# Patient Record
Sex: Female | Born: 1935 | Race: White | Hispanic: No | Smoking: Never smoker
Health system: Southern US, Community
[De-identification: ages and names within clinical notes are randomized; demographics above are authoritative.]

## PROBLEM LIST (undated history)

## (undated) DIAGNOSIS — I10 Essential (primary) hypertension: Secondary | ICD-10-CM

## (undated) DIAGNOSIS — I4891 Unspecified atrial fibrillation: Secondary | ICD-10-CM

## (undated) DIAGNOSIS — R609 Edema, unspecified: Secondary | ICD-10-CM

## (undated) DIAGNOSIS — R2689 Other abnormalities of gait and mobility: Secondary | ICD-10-CM

## (undated) DIAGNOSIS — F0390 Unspecified dementia without behavioral disturbance: Secondary | ICD-10-CM

## (undated) HISTORY — DX: Other abnormalities of gait and mobility: R26.89

## (undated) HISTORY — DX: Edema, unspecified: R60.9

## (undated) HISTORY — DX: Unspecified dementia without behavioral disturbance: F03.90

## (undated) HISTORY — DX: Unspecified atrial fibrillation: I48.91

## (undated) HISTORY — DX: Essential (primary) hypertension: I10

---

## 2005-12-14 ENCOUNTER — Emergency Department (HOSPITAL_COMMUNITY): Admission: EM | Admit: 2005-12-14 | Discharge: 2005-12-14 | Payer: Self-pay | Admitting: Emergency Medicine

## 2005-12-15 ENCOUNTER — Inpatient Hospital Stay (HOSPITAL_COMMUNITY): Admission: AD | Admit: 2005-12-15 | Discharge: 2005-12-18 | Payer: Self-pay | Admitting: Internal Medicine

## 2006-04-24 ENCOUNTER — Ambulatory Visit: Payer: Self-pay | Admitting: Internal Medicine

## 2006-05-05 ENCOUNTER — Ambulatory Visit: Payer: Self-pay | Admitting: Internal Medicine

## 2007-09-02 IMAGING — CT CT HEAD W/O CM
1 of 2 series · 13 of 30 positions shown, 17 images · IV contrast (agent unspecified)
Comparison: none

CLINICAL DATA: Fever, altered LOCX.  Reportedly also fell.  
 HEAD CT WITHOUT CONTRAST:
TECHNIQUE: Contiguous axial images were obtained from the base of the skull through the vertex according to standard protocol without contrast.

[Series 2: brain · axial · 0.47mm/px · z∈[+107,+237]mm · 13 of 32 slices shown, 17 images]
[im 3/32  brain]
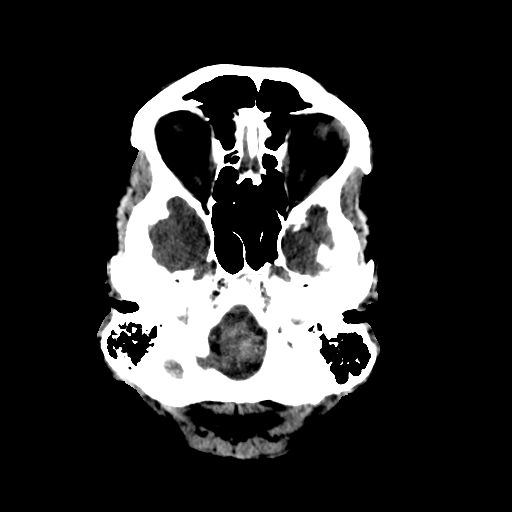
[im 3/32  bone]
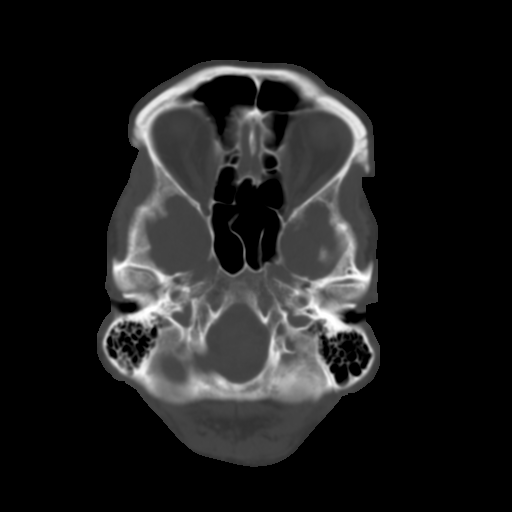
[im 5/32  brain]
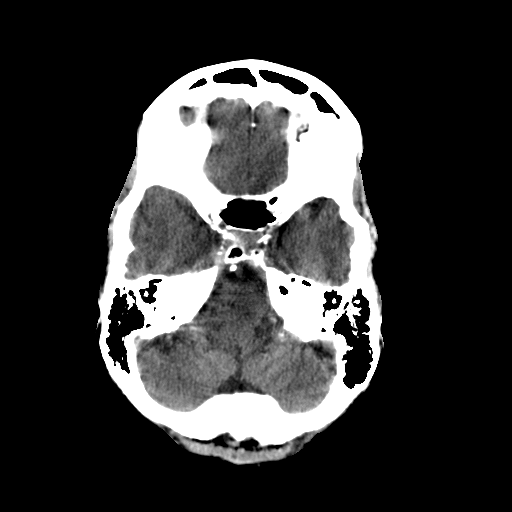
[im 7/32  brain]
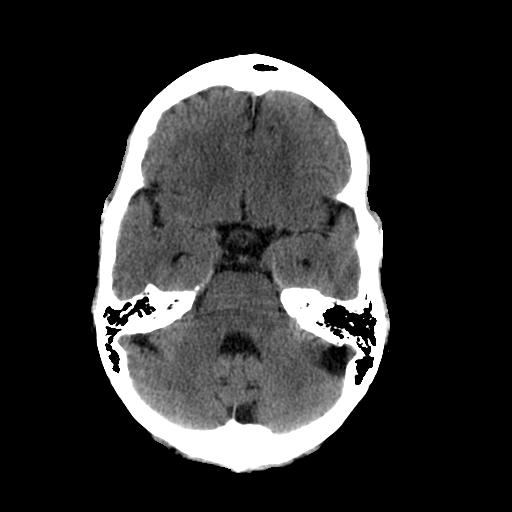
[im 9/32  brain]
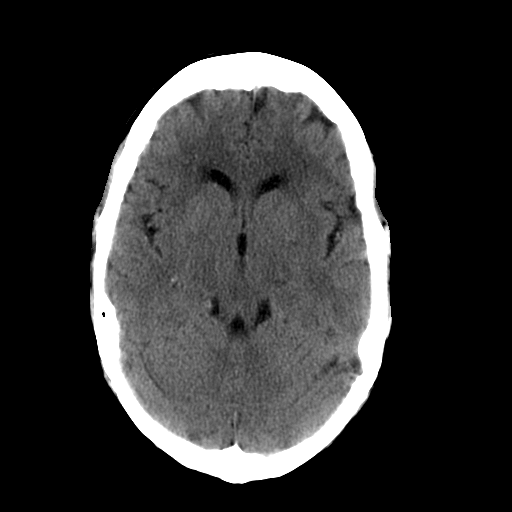
[im 12/32  brain]
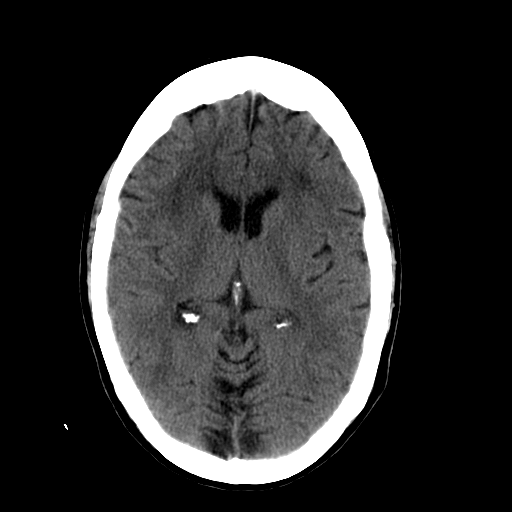
[im 12/32  bone]
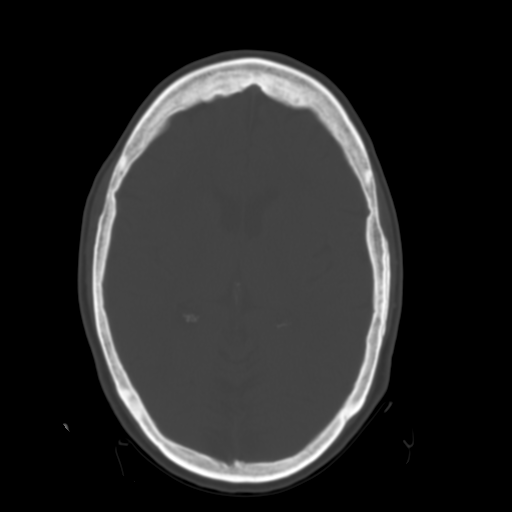
[im 14/32  brain]
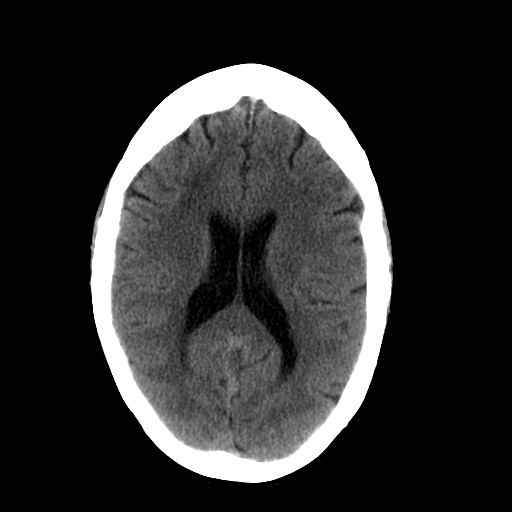
[im 16/32  brain]
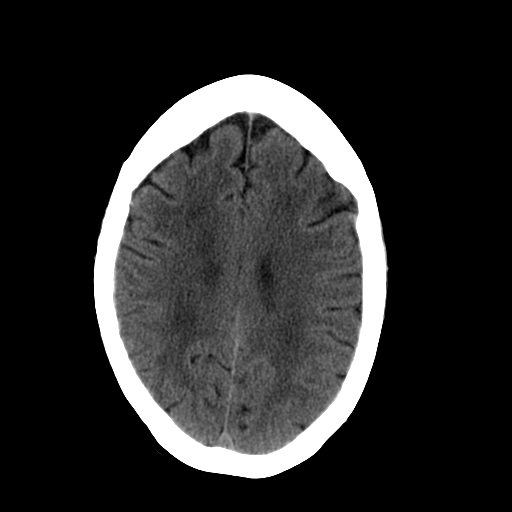
[im 18/32  brain]
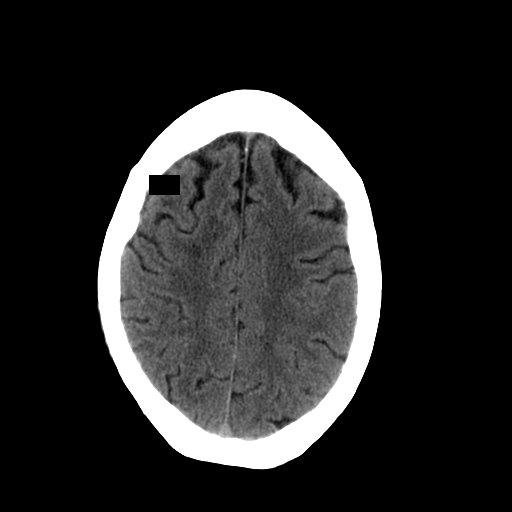
[im 20/32  brain]
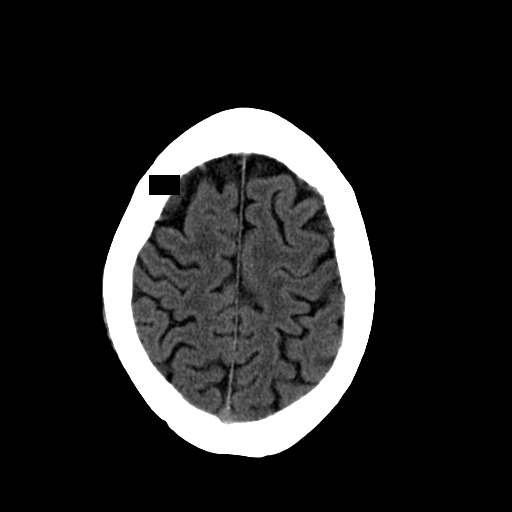
[im 20/32  bone]
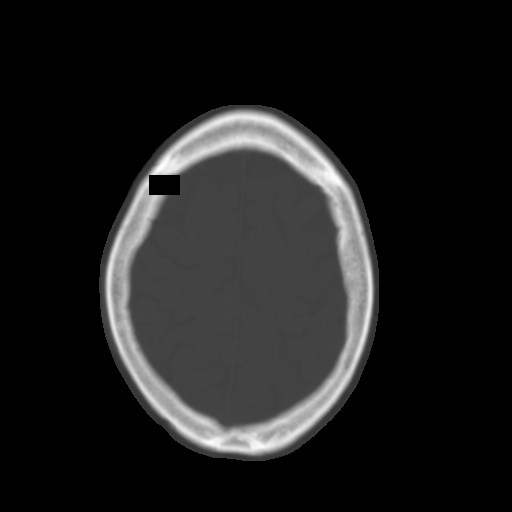
[im 23/32  brain]
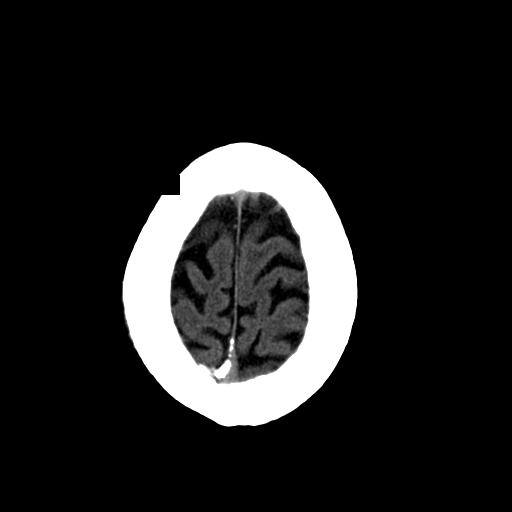
[im 25/32  brain]
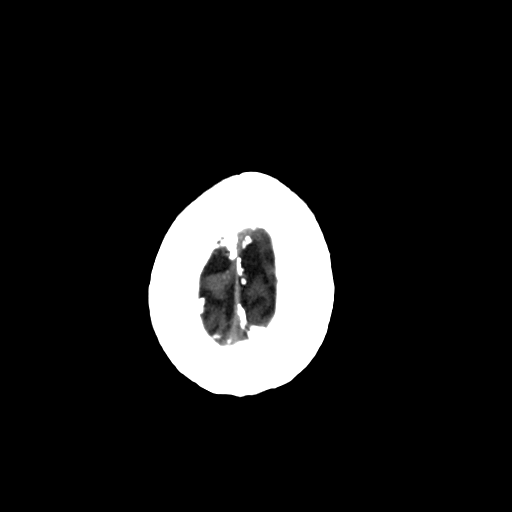
[im 27/32  brain]
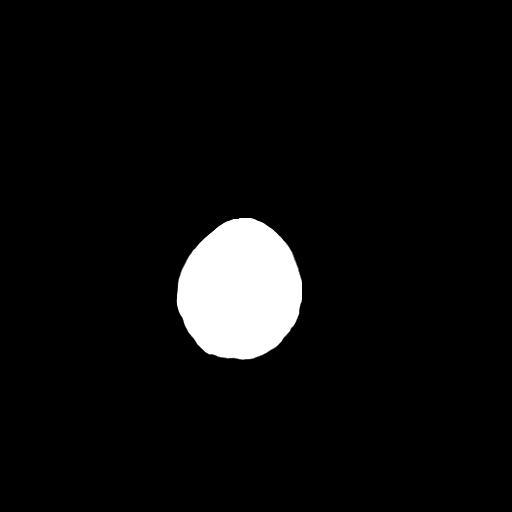
[im 29/32  brain]
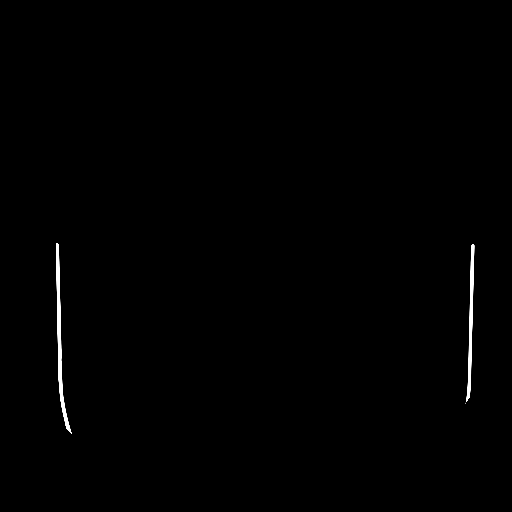
[im 29/32  bone]
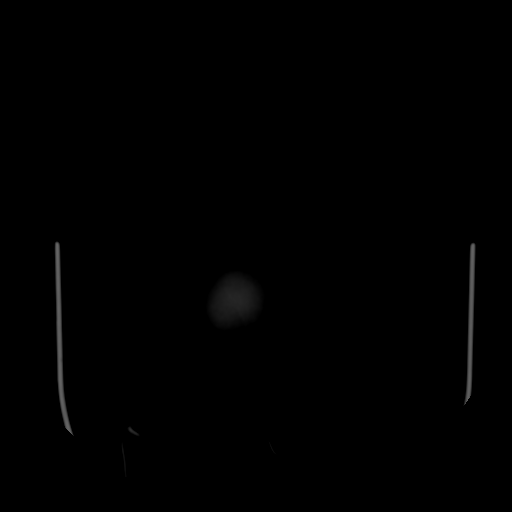

[13 of 30 positions shown; findings below may reference images not displayed]

FINDINGS: No acute intracranial abnormality.  No evidence of mass, hemorrhage, acute infarction or hydrocephalus.  Patchy hypodensities in the frontal and parietal deep white matter, compatible with chronic small vessel disease changes. 
 There is opacification of some of the air cells at the tip of the mastoid sinus.  This may represent a chronic finding.
IMPRESSION: No acute intracranial abnormality.  See comments above.

## 2008-07-08 ENCOUNTER — Inpatient Hospital Stay (HOSPITAL_COMMUNITY): Admission: RE | Admit: 2008-07-08 | Discharge: 2008-07-11 | Payer: Self-pay | Admitting: Obstetrics and Gynecology

## 2008-07-08 ENCOUNTER — Encounter (INDEPENDENT_AMBULATORY_CARE_PROVIDER_SITE_OTHER): Payer: Self-pay | Admitting: Obstetrics and Gynecology

## 2008-07-10 ENCOUNTER — Encounter (INDEPENDENT_AMBULATORY_CARE_PROVIDER_SITE_OTHER): Payer: Self-pay | Admitting: Cardiology

## 2008-07-10 ENCOUNTER — Encounter (INDEPENDENT_AMBULATORY_CARE_PROVIDER_SITE_OTHER): Payer: Self-pay | Admitting: Obstetrics and Gynecology

## 2010-05-30 HISTORY — PX: ABDOMINAL HYSTERECTOMY: SHX81

## 2010-09-14 LAB — BASIC METABOLIC PANEL
BUN: 5 mg/dL — ABNORMAL LOW (ref 6–23)
CO2: 26 mEq/L (ref 19–32)
CO2: 26 mEq/L (ref 19–32)
Calcium: 7.7 mg/dL — ABNORMAL LOW (ref 8.4–10.5)
Calcium: 8.1 mg/dL — ABNORMAL LOW (ref 8.4–10.5)
Calcium: 9.5 mg/dL (ref 8.4–10.5)
Chloride: 102 mEq/L (ref 96–112)
Chloride: 104 mEq/L (ref 96–112)
Creatinine, Ser: 0.78 mg/dL (ref 0.4–1.2)
GFR calc Af Amer: >60 mL/min (ref >60)
GFR calc Af Amer: >60 mL/min (ref >60)
GFR calc non Af Amer: >60 mL/min (ref >60)
GFR calc non Af Amer: >60 mL/min (ref >60)
Glucose, Bld: 104 mg/dL — ABNORMAL HIGH (ref 70–99)
Glucose, Bld: 99 mg/dL (ref 70–99)
Potassium: 3.7 mEq/L (ref 3.5–5.1)
Potassium: 4.3 mEq/L (ref 3.5–5.1)
Potassium: 4.4 mEq/L (ref 3.5–5.1)
Sodium: 133 mEq/L — ABNORMAL LOW (ref 135–145)
Sodium: 134 mEq/L — ABNORMAL LOW (ref 135–145)
Sodium: 136 mEq/L (ref 135–145)
Sodium: 138 mEq/L (ref 135–145)

## 2010-09-14 LAB — CBC
HCT: 32.7 % — ABNORMAL LOW (ref 36.0–46.0)
Hemoglobin: 14.5 g/dL (ref 12.0–15.0)
MCHC: 32.9 g/dL (ref 30.0–36.0)
MCHC: 33.4 g/dL (ref 30.0–36.0)
MCHC: 33.5 g/dL (ref 30.0–36.0)
MCV: 91.8 fL (ref 78.0–100.0)
MCV: 92.2 fL (ref 78.0–100.0)
Platelets: 178 10*3/uL (ref 150–400)
RBC: 3.51 MIL/uL — ABNORMAL LOW (ref 3.87–5.11)
RBC: 3.53 MIL/uL — ABNORMAL LOW (ref 3.87–5.11)
RDW: 13.5 % (ref 11.5–15.5)
WBC: 11.4 10*3/uL — ABNORMAL HIGH (ref 4.0–10.5)

## 2010-09-14 LAB — PROTIME-INR
INR: 0.9 (ref 0.00–1.49)
Prothrombin Time: 12.7 seconds (ref 11.6–15.2)

## 2010-09-14 LAB — CARDIAC PANEL(CRET KIN+CKTOT+MB+TROPI)
CK, MB: 1.6 ng/mL (ref 0.3–4.0)
Relative Index: INVALID (ref 0.0–2.5)
Troponin I: 0.05 ng/mL (ref 0.00–0.06)

## 2010-09-14 LAB — ABO/RH: ABO/RH(D): A NEG

## 2010-10-12 NOTE — H&P (Signed)
[name], [name] NO.:  [id number]   MEDICAL RECORD NO.:  [id number]          PATIENT TYPE:  INP   LOCATION:  4731                         FACILITY:  [facility]   PHYSICIAN:  [name], M.D.DATE OF BIRTH:  02-Aug-1926   DATE OF ADMISSION:  07/10/2008  DATE OF DISCHARGE:                              HISTORY & PHYSICAL   HISTORY:  The patient is a 77 year old female who has a history of  hypertension and hyperlipidemia.  She has been under significant  situational stress, taking care of her husband recently.  She was in for  hysterectomy and repair of uterine prolapse and tolerated surgery well.  Last evening, she went into atrial fibrillation with a rapid ventricular  response.  She was moved to the Intensive Care Unit at [facility]  and initially was treated with intravenous diltiazem, however, she was  then had an episode of wide complex tachycardia that resolved and this  was discontinued.  She was then given Lopressor 2.5 mg IV, but had some  hypotension.  This was held also.  She is asymptomatic at the present  time.  Initial cardiac enzymes were negative.  She has no prior history  of atrial fibrillation, but did have a history of a possible pericardial  effusion several years ago.   PAST MEDICAL HISTORY:  She has a history of hypertension and diabetes.  There is no history of ulcers or previous bleeding problems.  There is  no history of ulcers or bleeding problems.   PREVIOUS SURGICAL HISTORY:  Appendectomy, hernia repair, recent  hysterectomy, bladder repair, and tonsillectomy.   ALLERGIES:  No known.   MEDICATIONS:  1. Bisoprolol HCTZ 5/12.5 mg daily.  2. Benicar 40 mg daily.  3. Aspirin 81 mg daily.  4. Fosamax.   SOCIAL HISTORY:  She lives with her husband who has dementia.  She has a  high school education.  No tobacco or alcohol use.  She is currently  moved back to [place].  She is retired, job as an Electrical engineer.   FAMILY HISTORY:  Father had prostate cancer, age 96.  Mother had bladder  cancer, age 43.  Four brothers and two sisters.  Sisters had lymphoma.  Two brothers have had heart disease.  Son had myocardial infarction at  age 59 and has hyperlipidemia.   REVIEW OF SYSTEMS:  Significant situational stress previously.  She has  a previous history of pneumonia.  She has had previous cataract  extraction.  She has no other eye, ear, nose, or throat problems.  She  denies any history of GI bleeding or dyspepsia.  She has had recent  uterine prolapse and continence issues.  She has arthritis and has had a  previous leg fracture in the past.  No history of stroke or TIA.  Other  than as noted above, the remainder of systems unremarkable.   PHYSICAL EXAMINATION:  GENERAL:  Very pleasant female, sitting up in the  bed, currently in no acute distress.  Foley catheter in place.  VITAL SIGNS:  Blood pressure is 100/60.  Pulse was 111 and irregular.  SKIN:  Warm and dry.  ENT:  EOMI.  PERRLA.  CNS clear.  Fundi not examined.  Pharynx negative.  NECK:  Supple without masses, JVD, thyromegaly, or bruits.  LUNGS:  Clear to A&P.  CARDIAC:  Irregular rapid rhythm.  No S3 or murmur.  ABDOMEN:  Soft and nontender.  No mass or hepatosplenomegaly.  Surgical  incision is healed.  EXTREMITIES:  Femoral pulses are 2+ and distal pulses 2+.  No edema  noted.   A 12-lead EKG shows atrial fibrillation with rapid response.   LABORATORY DATA:  This morning shows negative cardiac enzymes.  TSH is  2.444.  Chemistry panel was normal.  Hemoglobin last night was 10.8.   IMPRESSION:  1. New onset of atrial fibrillation, current postoperatively,      asymptomatic.  2. History of hypertension.  3. Hyperlipidemia.  4. Postoperative state following hysterectomy and bladder repair.  5. Situational stress.   RECOMMENDATIONS:  The patient with new onset of atrial fibrillation.  Spoke with Dr.  Edward Jolly, the attending gynecologist, who preferred not to  do systemic anticoagulation for a couple of more days.  Because of the  relatively recent onset of atrial fibrillation, we will control rate  with intravenous Cardizem and begin amiodarone in an effort to convert  her back to sinus rhythm early.  I did review the strips that show  atrial fibrillation with rapid response.  There was 1 episode that may  have been ventricular tachycardia last night.      [name], M.D.  Electronically Signed     WST/MEDQ  D:  07/10/2008  T:  07/10/2008  Job:  [id number]   cc:   Kari Baars, M.D.  Randye Lobo, M.D.

## 2010-10-12 NOTE — Discharge Summary (Signed)
NAMELAMIYA, NAAS NO.:  [id number]   MEDICAL RECORD NO.:  [id number]           PATIENT TYPE:   LOCATION:                                 FACILITY:   PHYSICIAN:  Georga Hacking, M.D.DATE OF BIRTH:  08/13/1926   DATE OF ADMISSION:  07/08/2008  DATE OF DISCHARGE:  07/11/2008                               DISCHARGE SUMMARY   FINAL DIAGNOSES:  1. Postoperative atrial fibrillation, medically converted.  2. Wide-complex tachycardia, possible ventricular tachycardia versus      aberrancy, resolves isolated episode.  3. Hypertensive heart disease.  4. Hyperlipidemia, under treatment.  5. Stress incontinence with prolapsed uterus, status post surgery.   PROCEDURES:  Vaginal hysterectomy, sacrospinous vaginal vault  suspension, anterior-posterior colporrhaphy with Xenform grafts, slings,  mid urethral sling, and cystoscopy, and 2-D echocardiogram.   SURGEONS:  1. Randye Lobo, MD  2. Scott A. MacDiarmid, MD   HISTORY:  A 77 year old female with known hypertensive heart disease and  hyperlipidemia, who was admitted to [facility] for treatment of  incontinence.  She underwent surgery on July 08, 2008, and was in her  state of health until the evening of July 09, 2008, when she  developed rapid atrial fibrillation.  She was initially treated with a  bolus of diltiazem, but had some wide-complex tachycardia.  This was  stopped.  She received a dose of metoprolol 2.5 mg IV, but developed  some hypotension with that and I was consulted the next morning.  She  was transferred to [facility].  Please see the previously  dictated history and physical for remainder of the details.   HOSPITAL COURSE:  The patient was transferred to [facility]  following her surgery.  She was really not symptomatic, although she  complained of palpitations.  Talked with Dr. Edward Jolly, it was felt that the  risk of anticoagulation would be increased giving her  recent surgery  through multiple complex planes.  Because of the recent onset of atrial  fibrillation, we opted to try medical cardioversion.  She received  amiodarone 400 mg t.i.d. and did convert to sinus rhythm at 320 on  July 10, 2008.  She remained symptomatic.  Initial cardiac enzymes  were negative.  Postoperative hemoglobin was 10.8 with hematocrit of  32.2.  Sodium is 138, potassium 3.7, chloride 107, CO2 of 26, glucose  124, BUN 8, and creatinine 0.72.  She was doing quite well and was  discharged home the next day in improved condition.  She is to be on  aspirin for anticoagulation for the time being, amiodarone 200 mg b.i.d.  for a week and then daily, bisoprolol/hydrochlorothiazide 5/6.25 mg  daily, Benicar 40 mg daily, simvastatin 40 mg  daily, Fosamax 70 mg weekly, aspirin 81 mg daily, vitamins daily, and  vitamin D once a week.  She is to be seen by me an appointment in 1  week.  She was given instructions for a leg bag and is to have her Foley  catheter removed Monday at Dr. Mina Marble office.  She is to ambulate  and is to  call if there are recurrent problems.      Georga Hacking, M.D.  Electronically Signed     WST/MEDQ  D:  07/11/2008  T:  07/11/2008  Job:  [id number]   cc:   Randye Lobo, M.D.  Kari Baars, M.D.  Martina Sinner, MD

## 2010-10-12 NOTE — Op Note (Signed)
NAMESERINA, [name]               ACCOUNT NO.:  [id number]   MEDICAL RECORD NO.:  [id number]          PATIENT TYPE:  AMB   LOCATION:  SDC                           FACILITY:  [facility]   PHYSICIAN:  Martina Sinner, MD DATE OF BIRTH:  July 25, 1926   DATE OF PROCEDURE:  07/08/2008  DATE OF DISCHARGE:                               OPERATIVE REPORT   PREOPERATIVE DIAGNOSES:  Uterine vault prolapse plus cystocele plus  rectocele plus stress incontinence surgery.   PROCEDURES:  Vault suspension plus cystocele repair plus graft plus  rectocele repair plus graft plus sling plus cystoscopy.   PRIMARY SURGEON:  Randye Lobo, MD   ASSISTANT:  Martina Sinner, MD   Ms. [name] has severe uterine vault prolapse.  She is known to  have a right ureteropelvic junction obstruction.  The radiologist felt  that her ureters were normal in caliber.  She consented for the above  procedure.  Preoperative laboratory tests were normal.  Preoperative  antibiotics were given.  Extra care was taken in leg positioning to  minimize the risk of compartment syndrome, neuropathy, and DVT.   Dr. Edward Jolly performed a transvaginal hysterectomy.  She ran the posterior  cuff at the end of the case.  Blood loss was less than 30 mL.  This part  of the case will be dictated by Dr. Edward Jolly.  At this point, I took over  with an open vaginal cuff.   She had a very long anterior vaginal wall.  Two Allis clamps were placed  on the anterior cuff and I instilled 26 mL of lidocaine-epinephrine  mixture and dissected the anterior vaginal wall from the underlying  pubocervical fascia to the white line bilaterally.  A lot of extra care  was taken near the dome and at the apex not to over mobilize the bladder  at its apex or posteriorly.  We had marking sutures.  I had to stretch  uterosacral ligaments.  I had to keep good orientation.     The double ring retractor was utilized.  I did a 2-layer imbricating  anterior repair  not over mobilizing at the dome and not imbricating the  bladder neck.  Eventually, I cystoscoped the patient and there was  bilateral efflux.   At this point, after some more sharp dissection, I could finger dissect  the ischial spine bilaterally and we all could feel the sacrospinous  ligament bilaterally.  A 0 Ethibond was placed approximately 1 cm to 1.5  cm cephalad and posterior cephalad and caudal to the ischial spine.  I  double checked this and was very happy with the position and in fact I  changed the position a couple of times until the above position was  obtained.  There was minimal bleeding.   Two 1-cm incisions were made 1 fingerbreadth from the symphysis pubis  1.5 cm lateral to the midline.  With the bladder emptied, I passed the  [place] needle down along the back of the symphysis pubis on the pulp of my  index finger bilaterally.  I cystoscoped the patient.  There was  good  ureteral jets bilaterally.  There was no entrance into the bladder.  There was no injury to the urethra.  Then, I ended up passing the left  needle twice since on the first pass when I would remove the needle it  would wiggle the bladder a little bit more than I would like.  After the  second pass, the needle was not indenting on the bladder.   Again with the bladder emptied, I attached the sling and brought up  through the space.   It was after reducing the cystocele, we cut the links in the usual  fashion and made the sling a little bit looser than usual.  As usually  the case, the sling was a little bit more proximal because of the wide  open incision in the proximal third of the urethra.   I passed a 0 Vicryl on the UR6 needle through the pelvic sidewall near  the urethrovesical angle not attaching to the sling.   With 4 sutures in place, I tailored my 10 x 6 dermal graft like a  trapezoid and sewed it in place.  I was very happy with how it laid in.   I trimmed the anterior vaginal wall  appropriately.  I closed the  anterior vaginal wall with running 2-0 Vicryl on a CT-1 needle all the  way back to the apex.  I changed my retractor and there was excellent  support anteriorly and very good vaginal length.   At this point, Dr. Conley Simmonds closed the vaginal cuff with running 0  Vicryl.   It was obvious that she still needed more apical support.  She had  reasonably good length posteriorly.  I placed 2 Allis clamps and that is  difficult to identify hymenal ring.  I removed the small triangle of  perineal skin.  I then made a long perineal incision using my lead Allis  technique after instilling approximately 18 mL of lidocaine-epinephrine  mixture.  I went to approximately 1 cm from the cuff.  I dissected to  the pelvic sidewall bilaterally the posterior vaginal wall from the  rectovaginal fascia.  It was easy to finger dissect through the  pararectal space and feel the ischial spines bilaterally and my graft  sewn in to the above-described position.  It was actually tethering up  the sacrospinous ligament and I placed 2 more 0 Ethibonds one at each  side.  I double-checked the position.  I was very happy with them.  They  were just caudal and cephalad to the first sutures.   I tailored a dermal graft of the exact same size of the same shape and  sewed in place leaving a large trapdoor which was later attached to the  vaginal apex.  The left of the bone suture was brought through the  vaginal apex, and when I tied it down, it pulled the vaginal apex  cephalad to the patient's left.   Before doing the above maneuver, I closed the posterior vaginal wall  approximately 2.5 cm with 0 Vicryl on a CT-1 needle.  After sewing both  sacrospinous sutures in place, I finished the posterior closure.  Before  finishing the posterior closure, I tacked down the distal aspects of the  dermal graft with 0 Vicryl on the UR6 needle picking up a little bit of  rectovaginal fascia near  the pelvic sidewall.  The graft did not go all  the way to the posterior fourchette, but she had  little-to-no posterior  defect.  I did a digital rectal examination.  It was obvious that she  had a short rectovaginal wall and that she did not need a site defect  repair, but just needed a graft over the rectum and primarily an apical  repair.   The posterior running suture was exteriorized at the level of the  posterior fourchette and I closed the posterior fourchette and I closed  the perineal skin in a running subcuticular fashion.   She had excellent vaginal length and excellent support posteriorly and  anteriorly.  A digital rectal examination felt no sutures or distortion  of the rectum.  Total total blood loss was less than 150 mL.  Leg  position was good.  An Estrace vaginal pack was firmly placed for  hemostasis.  Foley catheter was draining blue urine and output was good.  A 4-0 Vicryl was used for the abdominal incisions followed by Dermabond.   I am hopeful that Ms. [name] surgery will greatly improve her  quality of life and we were all very pleased with her repair.           ______________________________  Martina Sinner, MD  Electronically Signed     SAM/MEDQ  D:  07/08/2008  T:  07/09/2008  Job:  [id number]

## 2010-10-12 NOTE — Op Note (Signed)
[name], [name] NO.:  [id number]   MEDICAL RECORD NO.:  [id number]          PATIENT TYPE:  AMB   LOCATION:  SDC                           FACILITY:  [facility]   PHYSICIAN:  Randye Lobo, M.D.   DATE OF BIRTH:  Mar 10, 1927   DATE OF PROCEDURE:  07/08/2008  DATE OF DISCHARGE:                               OPERATIVE REPORT   PREOPERATIVE DIAGNOSIS:  Uterine procidentia.   POSTOPERATIVE DIAGNOSIS:  Uterine procidentia.   PROCEDURE:  Total vaginal hysterectomy.   SURGEON:  Randye Lobo, MD   ASSISTANTS:  1. Roselee Nova, MD  2. Duane Boston, MD   ANESTHESIA:  General endotracheal, local with 0.5% lidocaine with  epinephrine 1:200,000.   ESTIMATED BLOOD LOSS:  Minimal.   URINE OUTPUT:  Adequate.   COMPLICATIONS:  None.   INDICATIONS FOR PROCEDURE:  The patient is a 77 year old para 11  Caucasian female who was referred by Dr. Alfredo Martinez for complete  uterine procidentia which had been unsuccessfully treated by pessary  use.  The patient was referred to coordinate surgical treatment which is  planned to be a total vaginal hysterectomy by me and then an anterior  and posterior colporrhaphy with graft placement, vaginal vault  suspension, mid urethral sling, and cystoscopy under Dr. Mina Marble  direction.  Risks, benefits, and alternatives of the surgery have been  reviewed with the patient who wishes to proceed.   FINDINGS:  Examination under anesthesia revealed complete uterine  procidentia.  The vaginal mucosa was in good condition without vaginal  ulceration or evidence of inflammation.  The uterus itself appeared to  be very thin and elongated.  No adnexal masses were appreciated.   Evaluation of the uterus during the hysterectomy revealed an elongated  cervix which measured approximately 10 cm in length and then an  elongated and very thin uterine fundus.  The tubes and ovaries could not  be visualized.   SPECIMEN:  The uterus and  cervix were sent to pathology.   PROCEDURE:  The patient was re-identified in the preoperative hold area.  She received Augmentin and gentamicin IV for antibiotic prophylaxis, and  she received both PAS stockings and TED hose for DVT prophylaxis.   In the operating room, the patient received general endotracheal  anesthesia, and she was then placed in the dorsal lithotomy position.  The lower abdomen, vagina, and perineum were then sterilely prepped and  draped.  A Foley catheter was sterilely placed inside the bladder and  remained so throughout the hysterectomy procedure.  A weighted speculum  was placed inside the vagina and a Jacobs tenaculum was placed on each  of the anterior and posterior cervical lips.  The cervix was then  injected circumferentially with 0.5% lidocaine with 1:200,000 of  epinephrine.  The cervix was then circumscribed with a scalpel.  A  combination of sharp and blunt dissection was performed in both the  anterior and the posterior cul-de-sacs.  The cul-de-sacs were not  entered initially due to the extremely long length of the cervix.  Each  of the bladder pillars were clamped, sharply divided,  and suture ligated  with transfixing sutures of 0 Vicryl.  The same was then performed on  the uterosacral ligaments bilaterally which were clamped, sharply  divided, and again suture ligated with transfixing sutures of 0 Vicryl.   Dissection continued in the posterior cul-de-sac and the posterior cul-  de-sac was ultimately entered sharply.  Digital exam confirmed proper  entry into this location and a long weighted speculum was then placed in  the posterior cul-de-sac.  The bladder was dissected off of the anterior  lower uterine segment.  The inferior cardinal ligaments were then  clamped, sharply divided, and suture ligated with 0 Vicryl bilaterally.  As the cervix was very elongated, there were several bites which needed  to be taken along the cardinal ligaments  along each side of the cervix.  These pedicles were all tied with 0 Vicryl sutures.   Eventually, entry into the anterior cul-de-sac could be performed  sharply with the Metzenbaum scissors and again digital exam confirmed  proper entry into this anatomic space.  A Deaver retractor was placed in  the anterior cul-de-sac.  The remainder of the cardinal ligaments were  then clamped, sharply divided, and suture ligated with 0 Vicryl  bilaterally.  The dissection continued along the anterior leaves of the  broad ligament.  These pedicles were clamped, sharply divided, and  suture ligated with 0 Vicryl bilaterally.  Eventually, the adnexal  pedicles were reached and the Heaney clamp was placed across the  proximal fallopian tube, utero-ovarian ligaments, and round ligaments  bilaterally.  The pedicles were first free tied with 0 Vicryl followed  by a suture ligature of the same.   The specimen was removed and was sent to pathology at this time.   A moistened 4 x 18 sponge was then placed in the peritoneal cavity with  a hemostat on the tag.  The pedicles were examined.  There was a small  amount of oozing noted along the vaginal cuff inferior to the  uterosacral ligaments where the peritoneum was dissected away from the  cuff.  This was treated on both sides with monopolar cautery.   Next, the posterior vaginal cuff was whipstitched with a running locked  suture of 0 Vicryl which created good hemostasis of the remaining  portion of the cuff.   At this point, the procedure was turned over to Dr. Sherron Monday who  performed the prolapse repair, sling, and cystoscopy.  Please refer to  that dictation separately.  After Dr. Sherron Monday completed the anterior  colporrhaphy with placement of the graft, I then closed the vaginal cuff  with a running lock suture of 0 Vicryl.  Hemostasis was noted to be  excellent.   Please refer again to the remaining dictation of Dr. Sherron Monday for the  rest of  the surgical procedure.   There were no complications to the hysterectomy procedure.  All needle,  instrument, and sponge counts were correct.      Randye Lobo, M.D.  Electronically Signed     BES/MEDQ  D:  07/08/2008  T:  07/09/2008  Job:  [id number]   cc:   Roselee Nova, MD

## 2010-10-15 NOTE — H&P (Signed)
[name], [name] NO.:  [id number]   MEDICAL RECORD NO.:  [id number]          PATIENT TYPE:  INP   LOCATION:  3742                         FACILITY:  [facility]   PHYSICIAN:  Kari Baars, M.D.  DATE OF BIRTH:  01-16-1927   DATE OF ADMISSION:  12/15/2005  DATE OF DISCHARGE:                                HISTORY & PHYSICAL   CHIEF COMPLAINT:  Weakness.   HISTORY OF PRESENT ILLNESS:  [name] is a relatively healthy, 77-year-  old, white female with a history of hypertension, hyperlipidemia and  microscopic hematuria who presented to the office today for follow up of  evaluation in the emergency department yesterday.  The patient states that  she has been feeling poorly for the past 6 days.  On Friday evening, she  developed headache and back pain while in [place].  She had a fever up to  101 on Saturday.  Her fever resolved and she drove home from [place] on  Monday.  Monday evening, she awoke with severe, drenching night sweats.  The  following morning, she had confusion, weakness and a recurrent fever up to  101.2.  While talking to her son on the phone, she dropped the phone to the  ground and apparently was slumped on the ground.  She is not sure if she  lost consciousness.  She was too weak to get up.  EMS was activated and she  went to the emergency department on July 18, where she was found to have a  right upper lobe pneumonia and a urinary tract infection.  Other evaluation  revealed a sodium of 128.  White blood count was 9.8.  Urinalysis had 7-10  white blood cells.  She does endorse cough which is nonproductive.  She has  had right shoulder pain, but no chest pain.  She has noticed a decrease in  her appetite.  She was discharged yesterday from the emergency room on p.o.  Avelox.  However, today she is feeling worse and called the office where she  is being evaluated.   REVIEW OF SYSTEMS:  All systems were reviewed with the patient and are  negative except as in HPI.  She denies any insect exposure.  No nausea or  vomiting.  No focal weakness.   PAST MEDICAL HISTORY:  1.  Hypertension.  2.  Hyperlipidemia.  3.  Microscopic hematuria with a negative workup in [place] in 1995.  4.  Status post left breast cyst aspiration (1980s).  5.  Osteoporosis.  6.  Status post abdominal hernia repair (1998).  7.  Status post tonsillectomy and adenoidectomy.  8.  Status post appendectomy.  9.  Status post right cataract repair.  10. Status post left fibula fracture and rib fractures.  11. Vitamin D Deficiency.   CURRENT MEDICATIONS:  1.  Avelox 400 mg daily started yesterday.  2.  Ziac 5/625 daily.  3.  Zocor 20 mg daily.  4.  Aspirin 81 mg daily.  5.  Citracal plus D twice daily.  6.  Metamucil 3-4 times weekly.  7.  Fosamax 70 mg weekly.  8.  Ergocalciferol 50,000 units weekly.   ALLERGIES:  NO KNOWN DRUG ALLERGIES.   SOCIAL HISTORY:  She is married and has three sons and 10 grandchildren. She  moved from [place], [place], in May 2005, but still spends a good bit of  time in [place] with her son.  She also has a son in [place].  She is  retired from job as an Pharmacologist.  She has  a high school education.  She is general a very well educated.  No tobacco,  alcohol or drug use.   FAMILY HISTORY:  Father had prostate cancer at the age of 67.  Mother had  bladder cancer at 25.  She has four brothers and two sisters.  Significant  for lymphoma in one sister and two brothers and heart disease in two  brothers.  Her son had an MI at age of 56 and has hyperlipidemia.   PHYSICAL EXAMINATION:  VITAL SIGNS:  Temperature 98.0, pulse initially noted  to be 48, but 52 manually, respirations 18, blood pressure 120/70, oxygen  saturation 96% on room air.  GENERAL:  Markedly fatigued, lying flat on the exam table.  HEENT:  Pupils equal, round, reactive to light.  Extraocular movements  intact.  No  scleral icterus. TMs clear bilaterally.  NECK:  Supple without meningismus.  No lymphadenopathy.  No thyromegaly.  HEART:  Irregular with frequent ectopy.  LUNGS:  Egophony in the right upper lobe.  Decreased breath sounds right  upper lobe, otherwise clear.  ABDOMEN:  Soft, nondistended, nontender with normoactive bowel sounds.  EXTREMITIES:  No clubbing, cyanosis or edema.  SKIN:  No rashes.  NEUROLOGIC:  Alert and oriented x3.  A little slower than normal.  Motor  strength is 5-/5 in all extremities.  No sensory deficits.  Deep tendon  reflexes 2+ and symmetric.   LABORATORY DATA AND X-RAY FINDINGS:  Labs in the office today show sodium  135, potassium 3.8, chloride 98, bicarb 25, BUN 15, creatinine 0.9, glucose  128.  Albumin 3.7, AST 74, ALT 49, alkaline phosphatase 85, total bilirubin  0.5.  CBC significant for white count of 8.2, hemoglobin 13.1, platelets  233.  Urinalysis has 5-10 white blood cells and 5-10 red blood cells, 1+  protein.   EKG shows normal sinus rhythm with PACs, left axis deviation, nonspecific ST  changes.  Chest x-ray shows dense right upper lobe infiltrate with distinct  infiltrate peripherally and above the fissure.   ASSESSMENT/PLAN:  1.  Right upper lobe pneumonia.  This is likely the cause of her fever,      generalized weakness and malaise.  The chest x-ray does have somewhat of      a peripheral and focal infiltrate.  Given the absence of leukocytosis in      her recent trip, will obtain a computed tomography scan of the chest to      rule out pulmonary embolus.  Will continue antibiotics, but change to      Rocephin and azithromycin for better urinary coverage as well as      coverage of atypical and atypical pneumonia.  Monitor vital signs,      oxygen status and chest x-ray.  2.  Generalized weakness.  Likely secondary to #1 with possible urinary      tract infection. 3.  Urinary tract infection.  Continue Rocephin IV.  Check urine and blood       cultures.  4.  Elevated liver function  tests.  This is likely due to her acute illness.      Will monitor liver function tests serially.  Hold her Statin therapy.      Will obtain a creatinine kinase.  5.  Hypertension.  Hold her Ziac given her hyponatremia yesterday.  Will      hydrate gently.  6.  Dehydration.  Will admit for intravenous fluids.  7.  Disposition.  Anticipate discharge in 2-3 days if her pneumonia is      improving and she is clinically stable.  8.  Deep venous thrombosis prophylaxis with Lovenox.      Kari Baars, M.D.  Electronically Signed     WS/MEDQ  D:  12/15/2005  T:  12/15/2005  Job:  [id number]

## 2010-10-15 NOTE — Discharge Summary (Signed)
[name], [name] NO.:  [id number]   MEDICAL RECORD NO.:  [id number]          PATIENT TYPE:  INP   LOCATION:  3742                         FACILITY:  [facility]   PHYSICIAN:  Kari Baars, M.D.  DATE OF BIRTH:  08-Mar-1927   DATE OF ADMISSION:  12/15/2005  DATE OF DISCHARGE:  12/18/2005                                 DISCHARGE SUMMARY   DISCHARGE DIAGNOSES:  1. Right upper lobe pneumonia.  2. Generalized weakness secondary to #1.  3. Urinary tract infection.  4. Elevated liver function tests.  5. Hypertension.  6. Dehydration  7. Hyperlipidemia.  8. Microscopic hematuria with a negative workup in [place] (1995).  9. Osteoporosis.  10.Status post left breast cyst aspiration (1980).  11.Status post abdominal hernia repair (1998).  12.Status post tonsillectomy and adenoidectomy.  13.Status post appendectomy.  14.Status post right cataract repair.  15.Status post left fibular fracture and rib fractures.  16.Vitamin D deficiency.   DISCHARGE MEDICATIONS:  1. Avelox 400 mg daily x6 more days.  2. Mucinex DM b.i.d. p.r.n. cough.  3. Ziac 5/6.25 to restart in 2 days.  4. Aspirin 81 mg daily.  5. Citracal plus D twice daily.  6. Fosamax 70 mg weekly.  7. Ergocalciferol 50,000 units weekly.  8. Restart Zocor after follow-up visit.   HOSPITAL PROCEDURES:  CT of the chest with contrast (July 20). No evidence  of acute pulmonary embolism.  Confluent posterior right upper lobe  infiltrate consistent with pneumonia. Mild right hilar adenopathy which is  likely reactive in nature.  Post treatment chest x-ray recommended to  confirm resolution.   HISTORY OF PRESENT ILLNESS:  For full details, please see dictated history  and physical.  Briefly, [name] is a previously relatively healthy 77-  year-old female with a history of hypertension, hyperlipidemia, and  osteoporosis who presented to the office on July 19 for persistent symptoms  which include fevers,  weakness, and cough.  She was evaluated in the  emergency department on the day prior to presentation and was discharged  home on Avelox due to pneumonia.  She described onset of symptoms about 6  days prior to admission which consisted of fevers, severe drenching night  sweats, confusion, weakness, and cough.  Her temperature was up to 101.2.  When she was talking to her son on the phone, she dropped the phone and  apparently was found to the ground.  She is not sure if she lost  consciousness.  She was too weak to get up.  EMS was activated and she was  brought to the emergency department on July 18 where she was found to have a  right upper lobe pneumonia.  In addition, she had hyponatremia with a sodium  of 128 and urinalysis showed 7-10 white blood cells.  She was discharge from  the emergency department on Avelox with a recommendation for outpatient  follow-up.  When she represented to the office on July 19, chest x-ray  showed a dense right upper lobe infiltrate.  Oxygen saturations were 96%.  The patient looked markedly fatigued and acutely ill.  Therefore, she was  admitted for further management.   HOSPITAL COURSE:  The patient was admitted to a telemetry bed.  Given the  chest x-ray appearance of peripheral focal infiltrate and relatively low  white blood count (8.2), a CT was obtained to rule out a pulmonary embolus  with possible infarct.  This was negative for PE but did show a dense area  of consolidation consistent with pneumonia.  She was placed on empiric  Rocephin and azithromycin for coverage of typical and atypical pneumonia as  well as for her urinary tract infection.  With this treatment, her symptoms  rapidly improved and she was feeling much better.  She was weaned off of  oxygen and felt stable for discharge on July 22 to complete six more days of  outpatient p.o. antibiotics.  She will follow up in office for follow-up  chest x-ray and reevaluation within the  next 10-14 days.   DISCHARGE LABS:  CBC shows a white count of  6.2, hemoglobin 12.1, platelets  257.  CMP,  sodium 138, potassium 3.9, chloride 106, bicarb 26, BUN 5,  creatinine 0.7, glucose 103.  LFT is mildly elevated with an AST of 54, ALT  63, total bilirubin 0.5.  Urinary Legionella antigen was negative.  Blood  cultures negative.  Urine culture no growth.   DISCHARGE INSTRUCTIONS:  She was instructed to drink lots of water and to  call if she has increasing shortness of breath, pain, or fever greater than  101.5.   DISCHARGE HOSPITAL FOLLOW-UP:  She will follow up with Dr. Clelia Croft in 10-14  days.   DISPOSITION:  To home.      Kari Baars, M.D.  Electronically Signed     WS/MEDQ  D:  02/01/2006  T:  02/01/2006  Job:  [id number]

## 2018-04-19 ENCOUNTER — Encounter: Payer: Self-pay | Admitting: Family Medicine

## 2018-04-19 ENCOUNTER — Ambulatory Visit (INDEPENDENT_AMBULATORY_CARE_PROVIDER_SITE_OTHER): Payer: Self-pay | Admitting: Family Medicine

## 2018-04-19 DIAGNOSIS — F0390 Unspecified dementia without behavioral disturbance: Secondary | ICD-10-CM

## 2018-04-19 DIAGNOSIS — I4891 Unspecified atrial fibrillation: Secondary | ICD-10-CM

## 2018-04-19 DIAGNOSIS — E7800 Pure hypercholesterolemia, unspecified: Secondary | ICD-10-CM

## 2018-04-19 DIAGNOSIS — H3530 Unspecified macular degeneration: Secondary | ICD-10-CM

## 2018-04-19 DIAGNOSIS — I10 Essential (primary) hypertension: Secondary | ICD-10-CM

## 2018-04-19 DIAGNOSIS — Z9181 History of falling: Secondary | ICD-10-CM

## 2018-04-19 DIAGNOSIS — R2689 Other abnormalities of gait and mobility: Secondary | ICD-10-CM

## 2018-04-19 DIAGNOSIS — W19XXXS Unspecified fall, sequela: Secondary | ICD-10-CM

## 2018-04-19 DIAGNOSIS — R609 Edema, unspecified: Secondary | ICD-10-CM

## 2018-04-19 NOTE — Patient Instructions (Signed)
It was a pleasure meeting you.  I believe you have mild dementia, probably Alzheimer's type.    Staying active, taking classes and eating healthy, especially the Mediterranean Diet, will help slow loss of memory.

## 2018-04-20 ENCOUNTER — Encounter: Payer: Self-pay | Admitting: Family Medicine

## 2018-04-20 DIAGNOSIS — R2689 Other abnormalities of gait and mobility: Secondary | ICD-10-CM

## 2018-04-20 DIAGNOSIS — H3530 Unspecified macular degeneration: Secondary | ICD-10-CM | POA: Insufficient documentation

## 2018-04-20 DIAGNOSIS — I10 Essential (primary) hypertension: Secondary | ICD-10-CM

## 2018-04-20 DIAGNOSIS — E7800 Pure hypercholesterolemia, unspecified: Secondary | ICD-10-CM | POA: Insufficient documentation

## 2018-04-20 DIAGNOSIS — I4891 Unspecified atrial fibrillation: Secondary | ICD-10-CM

## 2018-04-20 DIAGNOSIS — R609 Edema, unspecified: Secondary | ICD-10-CM

## 2018-04-20 DIAGNOSIS — W19XXXA Unspecified fall, initial encounter: Secondary | ICD-10-CM | POA: Insufficient documentation

## 2018-04-20 DIAGNOSIS — F0390 Unspecified dementia without behavioral disturbance: Secondary | ICD-10-CM

## 2018-04-20 DIAGNOSIS — Z9181 History of falling: Secondary | ICD-10-CM | POA: Insufficient documentation

## 2018-04-20 HISTORY — DX: Edema, unspecified: R60.9

## 2018-04-20 HISTORY — DX: Unspecified dementia, unspecified severity, without behavioral disturbance, psychotic disturbance, mood disturbance, and anxiety: F03.90

## 2018-04-20 HISTORY — DX: Essential (primary) hypertension: I10

## 2018-04-20 HISTORY — DX: Other abnormalities of gait and mobility: R26.89

## 2018-04-20 HISTORY — DX: Unspecified atrial fibrillation: I48.91

## 2018-04-20 NOTE — Assessment & Plan Note (Signed)
New diagnosis Evidence of progression of cognitive and functional impairments from son's report over the last year with cognitive impairment on MoCA of 15/30.  Mild stage of Dementia with resultant impaired abilities in instrumental activities of daily living in finances, complex travel outside home, learning new tasks or skills.  Mrs Glynda JaegerGilstrap has no dependence in her basic ADLs.  No behavioral or psychological symptoms of dementia present.   - Counseled patient and family regarding the diagnosis of dementia and progressive nature of the neurodegenerative disorder.  Provided packet of materials assembled for patients and families with more information about dementia. - Nonpharmacologic lifestyle interventions around diet, exercise and social activity recommended.

## 2018-04-20 NOTE — Assessment & Plan Note (Addendum)
Garen LahLawrie Beardsley PT performed a screening evaluation of Mrs [name] on 04/18/18 and found slow gait, proximal leg muscle weakness and balance issues.  I reviewed her findings and agree.   Ms [name] has all ready made recommendations to the patient for rehabilitation interventions.

## 2018-04-20 NOTE — Progress Notes (Signed)
[facility]:   Patient is accompanied by: son Primary caregiver: oldest son in [place] Patient's lives alone. Patient visiting her son, Linette Gunderson, in [place].  She will be returning to [place] soon and will begin living with her eldest son.  Patient information was obtained from patient and relative(s). History/Exam limitations: dementia. Primary Care Provider: No primary care provider on file. Referring provider: Berenda Morale, PT Reason for referral:  Chief Complaint  Patient presents with  . Memory Loss    Initial [facility] visit   Previous Report Reviewed: Physical Therapy screening evaluation by Garen Lah, PT on 04/19/18 showing slow gait and balance problems   Patient's Care Team No care team member to display  ------------------------------------------------------------------------------------------------------------------------------------------------------------------------------------------------------------------------------------------------------------------------------------------------------------------------------------------------------------------------------------------------------------------------------------------------------------------------------------------------------------   HPI by problems:  Chief Complaint  Patient presents with  . Memory Loss    Initial [facility] visit    Cognitive impairment concern  Are there problems with thinking?  memory loss  When were the changes first noticed?  about a year ago ago, after fall with hip fracture.  Fell over vacuum cleaner in home.   Did this change occur abruptly or gradually?  gradual  How have the changes progressed since then?  significant decline in the last year  Has there been any tremors or abnormal movements?  no  Have they had in hallucinations or delusions:  no  Have they appeared more anxious or sad lately?  no  Do they still have interests or activities  they enjor doing?  yes  How has their appetite been lately?  show no change  How has their sleep been lately?  No difficulties  Problem behaviors:  None   Compared to 5 to 10 years ago, how is the patient at:  Problems with Judgment, e.g., problem making decisions, bad financial decisions, problems with thinking?  no  Less interested in hobbies or previously enjoyed activities?  No  Problem remembering things about family and friends e.g. names,  occupations, birthdays, addresses?  No  Problem remembering conversations or news events a few days later?  yes  Problem remembering what day and month it is? no, uses prominent calendar and clock. Her eldest son arranges and reminds her of appointment.  Patient has gotten dressed to go to a doctor's appointment that was not scheduled.  Problem with losing things?  no  Problem learning to use a new gadget or machine around the house, e.g., cell phones, computer, microwave, remote control?  Yes, e.g. Smart phone.  Now using Jitterbug for last few days without difficulty  Problem with handling money for shopping?  no; usually uses a credit/debit card  Problem handling financial matters, e.g. their pension, checking, credit cards, dealing with the bank?  Eldest son manages Mrs Kho finances.  Has performed with duty for a long time.   Problem with getting lost in familiar places?  No  Problem with asking the same questions repeatedly or telling the same story repeatedly to the same person(s)?  It may be a few days in repeating herself   Geriatric Depression Scale:  4 / 15    Outpatient Encounter Medications as of 04/19/2018  Medication Sig  . apixaban (ELIQUIS) 2.5 MG TABS tablet Take 1 tablet (2.5 mg total) by mouth 2 (two) times daily.  . bisoprolol-hydrochlorothiazide (ZIAC) 10-6.25 MG tablet Take 1 tablet by mouth daily.  . Cholecalciferol 5000 UNIT/ML LIQD Place under the tongue.  . furosemide (LASIX) 20 MG  tablet Take one tablet in morning if needed for  fluid retention  . Multiple Vitamins-Minerals (PRESERVISION AREDS) TABS Take by mouth.  . omega-3 acid ethyl esters (LOVAZA) 1 g capsule Take by mouth 2 (two) times daily.  . potassium chloride (KLOR-CON) 8 MEQ tablet Take 1 tablet by mouth daily if take furosemide  . simvastatin (ZOCOR) 40 MG tablet Take 1 tablet (40 mg total) by mouth at bedtime.  . vitamin C (ASCORBIC ACID) 500 MG tablet Take 1 tablet (500 mg total) by mouth daily.  . [DISCONTINUED] furosemide (LASIX) 20 MG tablet Take 1 tablet (20 mg total) by mouth daily.  . [DISCONTINUED] potassium chloride SA (K-DUR,KLOR-CON) 20 MEQ tablet Take 1 tablet (20 mEq total) by mouth daily.   No facility-administered encounter medications on file as of 04/19/2018.     History Patient Active Problem List   Diagnosis Date Noted  . Atrial fibrillation (HCC) 04/20/2018    Priority: High  . Essential hypertension 04/20/2018    Priority: Medium  . Pure hypercholesterolemia 04/20/2018    Priority: Medium  . Macular degeneration 04/20/2018    Priority: Medium  . At risk for falling 04/20/2018    Priority: Low  . Falls 04/20/2018    Priority: Low  . Fluid retention 04/20/2018  . Balance problems 04/20/2018   Past Medical History:  Diagnosis Date  . Atrial fibrillation (HCC) 04/20/2018  . Balance problems 04/20/2018  . Essential hypertension 04/20/2018  . Fluid retention 04/20/2018   Past Surgical History:  Procedure Laterality Date  . ABDOMINAL HYSTERECTOMY  2012   Family History  Problem Relation Age of Onset  . Alzheimer's disease Sister   . Alzheimer's disease Brother   . Heart disease Brother   . High Cholesterol Brother   . High blood pressure Brother    Social History   Socioeconomic History  . Marital status: Widowed    Spouse name: Not on file  . Number of children: Not on file  . Years of education: 60  . Highest education level: High school graduate   Occupational History  . Occupation: retired  Engineer, production  . Financial resource strain: Not on file  . Food insecurity:    Worry: Not on file    Inability: Not on file  . Transportation needs:    Medical: Not on file    Non-medical: Not on file  Tobacco Use  . Smoking status: Never Smoker  . Smokeless tobacco: Never Used  Substance and Sexual Activity  . Alcohol use: Not Currently    Frequency: Never  . Drug use: Never  . Sexual activity: Not on file  Lifestyle  . Physical activity:    Days per week: 3 days    Minutes per session: Not on file  . Stress: Not on file  Relationships  . Social connections:    Talks on phone: Not on file    Gets together: Not on file    Attends religious service: Not on file    Active member of club or organization: Yes    Attends meetings of clubs or organizations: Not on file    Relationship status: Not on file  Other Topics Concern  . Not on file  Social History Narrative   Mrs Henzler was living alone in [place] in a [place]C, but will be moving in with her oldest son who is nearby.   Son designated to make medical decisions for her if she were unable.     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, [employer]      Cardiovascular Risk Factors: Hypertension and Lipids  Educational History: 12 years formal education Personal History of Seizures: No -  Personal History of Stroke: No -  Personal History of Head Trauma: No -  Personal History of Psychiatric Disorders: No -  Family History of Dementia: Yes - Brother and Sister with Alzheimer's Dementia     Basic Activities of Daily Living  Dressing: Self-care Eating: Self-care Ambulation: Self-care Toileting: Self-care Bathing: Self-care  Instrumental Activities of Daily Living Shopping: Self-care House/Yard Work: Self-care Administration of medications: Self-care Finances: Total  assistance Telephone: Self-care Transportation: Partial assistance, eldest son takes her to appointments.  Pt drives herself to grocery store across the street.      Formal Home Health Assistance  Physical Therapy: no  Occupational Therapy: no             Home Aid / Personal Care Service: no             Homemaker services: no  FALLS in last five office visits:  Fall Risk  04/19/2018  Falls in the past year? 1  Number falls in past yr: 1  Injury with Fall? 1    Health Maintenance reviewed:  There is no immunization history on file for this patient. The patient has no Health Maintenance topics  Diet: Regular  Geriatric Syndromes: Visual Impairment Hearing impairment: Wears biaural hearing aids Dentures present Ankle edema: yes  ROS See hpi  Vital Signs Weight: 128 lb (58.1 kg) There is no height or weight on file to calculate BMI. CrCl cannot be calculated (Patient's most recent lab result is older than the maximum 21 days allowed.). There is no height or weight on file to calculate BSA. Vitals:   04/19/18 1534  BP: (!) 144/80  Pulse: 89  Temp: 97.8 F (36.6 C)  TempSrc: Oral  SpO2: 99%  Weight: 128 lb (58.1 kg)   Wt Readings from Last 3 Encounters:  04/19/18 128 lb (58.1 kg)    Hearing Screening   125Hz  250Hz  500Hz  1000Hz  2000Hz  3000Hz  4000Hz  6000Hz  8000Hz   Right ear:   0 0 0  0    Left ear:   0 0 0  0    Hearing test without biaural hearing aids in place.    Visual Acuity Screening   Right eye Left eye Both eyes  Without correction: 20/40 20/100 20/40  With correction:       Physical Examination:  VS reviewed GEN: Alert, Cooperative, Groomed, NAD HEENT: PERRL; EAC biaural hearing aids present  EXT: No peripheral leg edema Neuro: Oriented to person, place, and time; Strength: 5/5 Bil. UE and LE symmetric; Sensation: Intact  Cerebellar: Finger-to-Nose intact, Rhomberg negative; Muscle Tone normal; Tremor not present;  Gait: No significant path  deviation, Step-through present, one-point cane Psych: Normal affect/thought/speech/language   Mini-Mental State Examination or Montreal Cognitive Assessment:  Patient did  require additional cues or prompts to complete tasks. Patient was cooperative and attentive to testing tasks Patient did  appear motivated to perform well  No flowsheet data found.      Montreal Cognitive Assessment  04/20/2018  Visuospatial/ Executive (0/5) 1  Naming (0/3) 2  Attention: Read list of digits (0/2) 2  Attention: Read list of letters (0/1) 1  Attention: Serial 7 subtraction starting at 100 (0/3) 1  Language: Repeat phrase (0/2) 1  Language : Fluency (0/1) 0  Abstraction (0/2) 1  Delayed Recall (0/5) 0  Orientation (0/6) 5  Total 14  Adjusted Score (based on education) 15      Labs No components found for: VITAMIND  No results found for: VITAMINB12  No results found for: FOLATE    No results found for: RPR  No results found for: HIV   No results found for: HGBA1C     Hearing Screening Without biaural hearing aids in place   125Hz  250Hz  500Hz  1000Hz  2000Hz  3000Hz  4000Hz  6000Hz  8000Hz   Right ear:   0 0 0  0    Left ear:   0 0 0  0      Visual Acuity Screening   Right eye Left eye Both eyes  Without correction: 20/40 20/100 20/40  With correction:       Personal Strengths Ability for insight Motivation for treatment/growth Physical Health Religious Affiliation Supportive family/friends  Support System Strengths Supportive Relationships, Family, Church, Spirituality and Able to Communicate Effectively   Advanced Directives Not  discussed   ------------------------------------------------------------------------------------------------------------------------------------------------------------------------------------------------------------------------------------------------------------------------------------------------------------------------------------------------------------------------------------------------------------------------------------------------------------------------------------------------------------  Assessment and Plan: Please see individual consultation notes from physical therapy, pharmacy and social work for today.    Problem List Items Addressed This Visit      High   Atrial fibrillation (HCC) (Chronic)   Relevant Medications   apixaban (ELIQUIS) 2.5 MG TABS tablet   bisoprolol-hydrochlorothiazide (ZIAC) 10-6.25 MG tablet   simvastatin (ZOCOR) 40 MG tablet   omega-3 acid ethyl esters (LOVAZA) 1 g capsule   furosemide (LASIX) 20 MG tablet     Medium   Essential hypertension (Chronic)   Relevant Medications   apixaban (ELIQUIS) 2.5 MG TABS tablet   bisoprolol-hydrochlorothiazide (ZIAC) 10-6.25 MG tablet   simvastatin (ZOCOR) 40 MG tablet   omega-3 acid ethyl esters (LOVAZA) 1 g capsule   furosemide (LASIX) 20 MG tablet   Pure hypercholesterolemia (Chronic)   Relevant Medications   apixaban (ELIQUIS) 2.5 MG TABS tablet   bisoprolol-hydrochlorothiazide (ZIAC) 10-6.25 MG tablet   simvastatin (ZOCOR) 40 MG tablet   omega-3 acid ethyl esters (LOVAZA) 1 g capsule   furosemide (LASIX) 20 MG tablet   Macular degeneration (Chronic)   Relevant Medications   Multiple Vitamins-Minerals (PRESERVISION AREDS) TABS     Low   At risk for falling (Chronic)   Falls     Unprioritized   Fluid retention   Relevant Medications   furosemide (LASIX) 20 MG tablet   Balance problems     No problem-specific Assessment & Plan notes found for this encounter.  >45 minutes face to face where  spent in total with counseling / coordination of care took more than 23 minutes of the total time. Counseling involved discussion with patient and patient's children the stage, nature, prognosis, treatment (phamacologic and nonpharmacologic, benefits and harms) . NIH educational materials on dementia and memory loss given to patient and her son.  Questions elicited and answered.   [facility]    > 60 minutes face to face were spent in total with interdisciplinary discussion, patient and caretaker counseling and coordination of care took more than 20 minutes. The Geriatric interdisciplinary team meet to discuss the patient's assessment, problem list, and recommendations.  The interdisciplinary team consisted of representatives from medicine, pharmacy, physical therapy and social work. The interdisciplinary team meet with the patient and caretakers to review the team's findings, assessments, and recommendations.
